# Patient Record
Sex: Male | Born: 2000 | Hispanic: Yes | Marital: Single
Health system: Southern US, Community
[De-identification: ages and names within clinical notes are randomized; demographics above are authoritative.]

---
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
## 2018-07-27 ENCOUNTER — Emergency Department: Payer: No Typology Code available for payment source

## 2018-07-27 ENCOUNTER — Emergency Department
Admission: EM | Admit: 2018-07-27 | Discharge: 2018-07-27 | Disposition: A | Discharge status: Home / Self Care | Payer: No Typology Code available for payment source | Attending: Emergency Medicine | Admitting: Emergency Medicine

## 2018-07-27 DIAGNOSIS — Y999 Unspecified external cause status: Secondary | ICD-10-CM | POA: Diagnosis not present

## 2018-07-27 DIAGNOSIS — Y92410 Unspecified street and highway as the place of occurrence of the external cause: Secondary | ICD-10-CM | POA: Insufficient documentation

## 2018-07-27 DIAGNOSIS — J189 Pneumonia, unspecified organism: Secondary | ICD-10-CM | POA: Insufficient documentation

## 2018-07-27 DIAGNOSIS — S161XXA Strain of muscle, fascia and tendon at neck level, initial encounter: Secondary | ICD-10-CM | POA: Diagnosis not present

## 2018-07-27 DIAGNOSIS — S199XXA Unspecified injury of neck, initial encounter: Secondary | ICD-10-CM | POA: Diagnosis present

## 2018-07-27 DIAGNOSIS — Y9389 Activity, other specified: Secondary | ICD-10-CM | POA: Diagnosis not present

## 2018-07-27 MED ORDER — AZITHROMYCIN 250 MG PO TABS: ORAL_TABLET | ORAL | 0 refills | Status: AC
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
## 2018-07-27 NOTE — Discharge Instructions (Addendum)
Follow-up with your regular doctor if not better in 5 7 days.  Take medication as prescribed.  Return emergency department if worsening.
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
## 2020-01-22 ENCOUNTER — Ambulatory Visit: Payer: Self-pay | Attending: Internal Medicine

## 2020-01-22 DIAGNOSIS — Z23 Encounter for immunization: Secondary | ICD-10-CM

## 2020-01-22 NOTE — Progress Notes (Signed)
   Covid-19 Vaccination Clinic  Name:  Calvin Young    MRN: [id number] DOB: [birth year]  01/22/2020  Mr. [name] was observed post Covid-19 immunization for 15 minutes without incident. He was provided with Vaccine Information Sheet and instruction to access the V-Safe system.   Mr. [name] was instructed to call 911 with any severe reactions post vaccine: Marland Kitchen Difficulty breathing  . Swelling of face and throat  . A fast heartbeat  . A bad rash all over body  . Dizziness and weakness   Immunizations Administered    Name Date Dose VIS Date Route   Pfizer COVID-19 Vaccine 01/22/2020  8:47 AM 0.3 mL 10/15/2019 Intramuscular   Manufacturer: [employer], [employer]   Lot: [id number]   NDC: [address]-1

## 2020-02-12 ENCOUNTER — Ambulatory Visit: Payer: Self-pay | Attending: Internal Medicine

## 2020-02-12 DIAGNOSIS — Z23 Encounter for immunization: Secondary | ICD-10-CM

## 2020-02-12 NOTE — Progress Notes (Signed)
   Covid-19 Vaccination Clinic  Name:  Byan Poplaski    MRN: [id number] DOB: 02-08-2001  02/12/2020  Mr. Pablo Stauffer was observed post Covid-19 immunization for 15 minutes   without incident. He was provided with Vaccine Information Sheet and instruction to access the V-Safe system.   Mr. Bradey Luzier was instructed to call 911 with any severe reactions post vaccine: Marland Kitchen Difficulty breathing  . Swelling of face and throat  . A fast heartbeat  . A bad rash all over body  . Dizziness and weakness   Immunizations Administered    Name Date Dose VIS Date Route   Pfizer COVID-19 Vaccine 02/12/2020  8:29 AM 0.3 mL 10/15/2019 Intramuscular   Manufacturer: [employer], [employer]   Lot: [id number]   NDC: [address]-1

## 2020-12-11 ENCOUNTER — Other Ambulatory Visit: Payer: Self-pay | Admitting: Nurse Practitioner

## 2020-12-11 DIAGNOSIS — R1012 Left upper quadrant pain: Secondary | ICD-10-CM
